# Patient Record
Sex: Male | Born: 1960 | Race: White | Hispanic: Yes | Marital: Married | Smoking: Never smoker
Health system: Southern US, Community
[De-identification: ages and names within clinical notes are randomized; demographics above are authoritative.]

## PROBLEM LIST (undated history)

## (undated) DIAGNOSIS — E119 Type 2 diabetes mellitus without complications: Secondary | ICD-10-CM

## (undated) DIAGNOSIS — M109 Gout, unspecified: Secondary | ICD-10-CM

## (undated) DIAGNOSIS — I10 Essential (primary) hypertension: Secondary | ICD-10-CM

---

## 2018-04-04 ENCOUNTER — Emergency Department (HOSPITAL_BASED_OUTPATIENT_CLINIC_OR_DEPARTMENT_OTHER): Payer: BLUE CROSS/BLUE SHIELD

## 2018-04-04 ENCOUNTER — Other Ambulatory Visit: Payer: Self-pay

## 2018-04-04 ENCOUNTER — Observation Stay (HOSPITAL_BASED_OUTPATIENT_CLINIC_OR_DEPARTMENT_OTHER)
Admission: EM | Admit: 2018-04-04 | Discharge: 2018-04-05 | Disposition: A | Discharge status: Home / Self Care | Payer: BLUE CROSS/BLUE SHIELD | Attending: Internal Medicine | Admitting: Internal Medicine

## 2018-04-04 ENCOUNTER — Encounter (HOSPITAL_BASED_OUTPATIENT_CLINIC_OR_DEPARTMENT_OTHER): Payer: Self-pay | Admitting: Emergency Medicine

## 2018-04-04 DIAGNOSIS — R079 Chest pain, unspecified: Secondary | ICD-10-CM

## 2018-04-04 DIAGNOSIS — G4733 Obstructive sleep apnea (adult) (pediatric): Secondary | ICD-10-CM | POA: Diagnosis not present

## 2018-04-04 DIAGNOSIS — Z6835 Body mass index (BMI) 35.0-35.9, adult: Secondary | ICD-10-CM | POA: Diagnosis not present

## 2018-04-04 DIAGNOSIS — Z23 Encounter for immunization: Secondary | ICD-10-CM | POA: Insufficient documentation

## 2018-04-04 DIAGNOSIS — M546 Pain in thoracic spine: Secondary | ICD-10-CM | POA: Diagnosis not present

## 2018-04-04 DIAGNOSIS — E559 Vitamin D deficiency, unspecified: Secondary | ICD-10-CM | POA: Diagnosis not present

## 2018-04-04 DIAGNOSIS — I10 Essential (primary) hypertension: Secondary | ICD-10-CM | POA: Diagnosis not present

## 2018-04-04 DIAGNOSIS — Z79899 Other long term (current) drug therapy: Secondary | ICD-10-CM | POA: Diagnosis not present

## 2018-04-04 DIAGNOSIS — M109 Gout, unspecified: Secondary | ICD-10-CM | POA: Diagnosis present

## 2018-04-04 DIAGNOSIS — E119 Type 2 diabetes mellitus without complications: Secondary | ICD-10-CM | POA: Insufficient documentation

## 2018-04-04 DIAGNOSIS — E785 Hyperlipidemia, unspecified: Secondary | ICD-10-CM | POA: Diagnosis not present

## 2018-04-04 DIAGNOSIS — E1165 Type 2 diabetes mellitus with hyperglycemia: Secondary | ICD-10-CM

## 2018-04-04 DIAGNOSIS — Z7982 Long term (current) use of aspirin: Secondary | ICD-10-CM | POA: Insufficient documentation

## 2018-04-04 DIAGNOSIS — R0789 Other chest pain: Secondary | ICD-10-CM | POA: Diagnosis not present

## 2018-04-04 HISTORY — DX: Type 2 diabetes mellitus without complications: E11.9

## 2018-04-04 HISTORY — DX: Essential (primary) hypertension: I10

## 2018-04-04 HISTORY — DX: Gout, unspecified: M10.9

## 2018-04-04 LAB — BASIC METABOLIC PANEL
Anion gap: 7 (ref 5–15)
BUN: 20 mg/dL (ref 6–20)
CHLORIDE: 104 mmol/L (ref 98–111)
CO2: 22 mmol/L (ref 22–32)
Calcium: 8.7 mg/dL — ABNORMAL LOW (ref 8.9–10.3)
Creatinine, Ser: 1.01 mg/dL (ref 0.61–1.24)
GFR calc Af Amer: >60 mL/min (ref >60)
GFR calc non Af Amer: >60 mL/min (ref >60)
GLUCOSE: 305 mg/dL — AB (ref 70–99)
Potassium: 4.1 mmol/L (ref 3.5–5.1)
Sodium: 133 mmol/L — ABNORMAL LOW (ref 135–145)

## 2018-04-04 LAB — CBC
HEMATOCRIT: 39.7 % (ref 39.0–52.0)
Hemoglobin: 13.8 g/dL (ref 13.0–17.0)
MCH: 30.6 pg (ref 26.0–34.0)
MCHC: 34.8 g/dL (ref 30.0–36.0)
MCV: 88 fL (ref 80.0–100.0)
Platelets: 199 10*3/uL (ref 150–400)
RBC: 4.51 MIL/uL (ref 4.22–5.81)
RDW: 12 % (ref 11.5–15.5)
WBC: 6.5 10*3/uL (ref 4.0–10.5)
nRBC: 0 % (ref 0.0–0.2)

## 2018-04-04 LAB — GLUCOSE, CAPILLARY: Glucose-Capillary: 216 mg/dL — ABNORMAL HIGH (ref 70–99)

## 2018-04-04 LAB — TROPONIN I
Troponin I: <0.03 ng/mL (ref <0.03)
Troponin I: <0.03 ng/mL (ref <0.03)

## 2018-04-04 MED ORDER — ACETAMINOPHEN 325 MG PO TABS: 650.0000 mg | ORAL_TABLET | ORAL | Status: DC | PRN

## 2018-04-04 MED ORDER — ONDANSETRON HCL 4 MG/2ML IJ SOLN: 4.0000 mg | Freq: Four times a day (QID) | INTRAMUSCULAR | Status: DC | PRN

## 2018-04-04 MED ORDER — ENOXAPARIN SODIUM 40 MG/0.4ML ~~LOC~~ SOLN: 40.0000 mg | SUBCUTANEOUS | Status: DC

## 2018-04-04 MED ORDER — ASPIRIN 81 MG PO CHEW
324.0000 mg | CHEWABLE_TABLET | Freq: Once | ORAL | Status: AC
  Administered 2018-04-04: 324 mg via ORAL
  Filled 2018-04-04: qty 4

## 2018-04-04 MED ORDER — NITROGLYCERIN 0.4 MG SL SUBL: 0.4000 mg | SUBLINGUAL_TABLET | SUBLINGUAL | Status: DC | PRN

## 2018-04-04 MED ORDER — INFLUENZA VAC SPLIT QUAD 0.5 ML IM SUSY
0.5000 mL | PREFILLED_SYRINGE | INTRAMUSCULAR | Status: AC
  Administered 2018-04-05: 0.5 mL via INTRAMUSCULAR
  Filled 2018-04-04: qty 0.5

## 2018-04-04 MED ORDER — ALPRAZOLAM 0.5 MG PO TABS: 0.5000 mg | ORAL_TABLET | Freq: Every evening | ORAL | Status: DC | PRN

## 2018-04-04 MED ORDER — INSULIN ASPART 100 UNIT/ML ~~LOC~~ SOLN
0.0000 [IU] | Freq: Three times a day (TID) | SUBCUTANEOUS | Status: DC
  Administered 2018-04-05 (×2): 8 [IU] via SUBCUTANEOUS

## 2018-04-04 NOTE — ED Triage Notes (Signed)
Patient states that at about 130 pm he started to have pain to he left shoulder, and scapula around to his left chest - he started to became dizzy and had SOB - the patient denies any of these symptoms at this time

## 2018-04-04 NOTE — H&P (Signed)
History and Physical    James Shannon:[id number] DOB: December 28, 1960 DOA: 04/04/2018  PCP: Patient, No Pcp Per   Patient coming from: [facility]/  I have personally briefly reviewed patient's old medical records in [facility]  Chief Complaint: Chest pain.  HPI: James Shannon is a 57 y.o. male with medical history significant of type 2 diabetes, gout, hypertension currently not on medications who is coming to the emergency department with complaints of chest pain.  He states that the pain started around 1:30 PM while he was sitting, pressure-like, started on his left mid back and he went around all the way to his left precordial area in left shoulder.  Associated symptoms well with mild dyspnea, palpitations and mild nausea.  There was no dizziness or emesis.  The pain lasted around 15 minutes and the resolved on its own.  He complains of having episodes of PND recently, last one was about 2 nights ago.  He is supposed to be on CPAP at 9, but has trouble tolerating the mask, which makes him claustrophobic.  He denies orthopnea or pitting edema of the lower extremities.  No headache, vision changes, sore throat, wheezing, hemoptysis, abdominal pain, diarrhea, constipation, melena or hematochezia.  Denies dysuria, frequency or hematuria.  No polyuria, polydipsia, polyphagia or blurred vision.  He denies skin pruritus.  ED Course: Initial vital signs temperature 98.6 F, pulse 58, respirations 18, blood pressure 155/83 mmHg and O2 sat 100% on room air.  The patient received 324 mg of aspirin in the emergency department.  EKGs showed sinus bradycardia with left axis deviation.  There is no previous tracing to compare to.  Troponin x2 so far negative. CBC was normal.  Sodium was 133 mmol/L.  Glucose 305 and calcium 8.7 mg/dL.  All other CMP values are within normal limits.  His chest radiograph showed mild calcific atherosclerotic disease and tortuosity of the thoracic aortic.  Otherwise no active  cardiopulmonary disease.  Please see images and full radiology report for further detail.  Review of Systems: As per HPI otherwise 10 point review of systems negative.   Past Medical History:  Diagnosis Date  . Diabetes mellitus without complication (HCC)   . Gout   . Hypertension     History reviewed. No pertinent surgical history.   reports that he has never smoked. He has never used smokeless tobacco. He reports that he drinks alcohol. He reports that he does not use drugs.  No Known Allergies  Family medical history Mother- Type 2 diabetes.  Prior to Admission medications   Medication Sig Start Date End Date Taking? Authorizing Provider  allopurinol (ZYLOPRIM) 100 MG tablet Take 2 tablet twice daily. 06/30/17  Yes [provider]  lisinopril (PRINIVIL,ZESTRIL) 20 MG tablet Take 20 mg by mouth daily.  06/30/17  Yes [provider]  metFORMIN (GLUCOPHAGE) 850 MG tablet Take 850 mg by mouth daily with breakfast.  06/30/17  Yes [provider]    Physical Exam: Vitals:   04/04/18 1730 04/04/18 1800 04/04/18 1858 04/04/18 2033  BP: 140/89 134/77 (!) 160/103 136/90  Pulse: (!) 57 (!) 52 (!) 53 (!) 54  Resp: 19   20  Temp:   98.7 F (37.1 C) 98.3 F (36.8 C)  TempSrc:   Oral Oral  SpO2: 100% 97% 100% 96%  Weight:      Height:        Constitutional: NAD, calm, comfortable Eyes: PERRL, lids and conjunctivae normal ENMT: Mucous membranes are moist. Posterior pharynx  clear of any exudate or lesions. Neck: normal, supple, no masses, no thyromegaly Respiratory: clear to auscultation bilaterally, no wheezing, no crackles. Normal respiratory effort. No accessory muscle use.  Cardiovascular: Regular rate and rhythm, no murmurs / rubs / gallops. No extremity edema. 2+ pedal pulses. No carotid bruits.  Abdomen: Obese, soft, no tenderness, no masses palpated. No hepatosplenomegaly. Bowel sounds positive.  Musculoskeletal: no clubbing / cyanosis.. Good ROM, no  contractures. Normal muscle tone.  Skin: no rashes, lesions, ulcers. No induration Neurologic: CN 2-12 grossly intact. Sensation intact, DTR normal. Strength 5/5 in all 4.  Psychiatric: Normal judgment and insight. Alert and oriented x 3. Normal mood.   Labs on Admission: I have personally reviewed following labs and imaging studies  CBC: Recent Labs  Lab 04/04/18 1442  WBC 6.5  HGB 13.8  HCT 39.7  MCV 88.0  PLT 199   Basic Metabolic Panel: Recent Labs  Lab 04/04/18 1442  NA 133*  K 4.1  CL 104  CO2 22  GLUCOSE 305*  BUN 20  CREATININE 1.01  CALCIUM 8.7*   GFR: Estimated Creatinine Clearance: 96.7 mL/min (by C-G formula based on SCr of 1.01 mg/dL). Liver Function Tests: No results for input(s): AST, ALT, ALKPHOS, BILITOT, PROT, ALBUMIN in the last 168 hours. No results for input(s): LIPASE, AMYLASE in the last 168 hours. No results for input(s): AMMONIA in the last 168 hours. Coagulation Profile: No results for input(s): INR, PROTIME in the last 168 hours. Cardiac Enzymes: Recent Labs  Lab 04/04/18 1442 04/04/18 1736  TROPONINI <0.03 <0.03   BNP (last 3 results) No results for input(s): PROBNP in the last 8760 hours. HbA1C: No results for input(s): HGBA1C in the last 72 hours. CBG: No results for input(s): GLUCAP in the last 168 hours. Lipid Profile: No results for input(s): CHOL, HDL, LDLCALC, TRIG, CHOLHDL, LDLDIRECT in the last 72 hours. Thyroid Function Tests: No results for input(s): TSH, T4TOTAL, FREET4, T3FREE, THYROIDAB in the last 72 hours. Anemia Panel: No results for input(s): VITAMINB12, FOLATE, FERRITIN, TIBC, IRON, RETICCTPCT in the last 72 hours. Urine analysis: No results found for: COLORURINE, APPEARANCEUR, LABSPEC, PHURINE, GLUCOSEU, HGBUR, BILIRUBINUR, KETONESUR, PROTEINUR, UROBILINOGEN, NITRITE, LEUKOCYTESUR  Radiological Exams on Admission: Dg Chest 2 View  Result Date: 04/04/2018 CLINICAL DATA:  Left-sided chest pain. EXAM: CHEST -  2 VIEW COMPARISON:  None. FINDINGS: Cardiomediastinal silhouette is normal. Mediastinal contours appear intact. Mild calcific atherosclerotic disease and tortuosity of the thoracic aorta. There is no evidence of focal airspace consolidation, pleural effusion or pneumothorax. Osseous structures are without acute abnormality. Soft tissues are grossly normal. IMPRESSION: Mild calcific atherosclerotic disease and tortuosity of the thoracic aorta. Otherwise no active cardiopulmonary disease. Electronically Signed   By: Ted Mcalpineobrinka  Dimitrova M.D.   On: 04/04/2018 15:39    EKG: Independently reviewed.  EKG #1 Vent. rate 54 BPM PR interval 194 ms QRS duration 98 ms QT/QTc 438/415 ms P-R-T axes 54 -38 49 Sinus bradycardia Left axis deviation Abnormal ECG  EKG #2 Vent. rate 55 BPM PR interval 182 ms QRS duration 98 ms QT/QTc 444/424 ms P-R-T axes 53 -40 53 Sinus bradycardia Left axis deviation Possible Anterior infarct , age undetermined  Assessment/Plan Principal Problem:   Atypical chest pain Observation/telemetry. Supplemental oxygen as needed. Sublingual nitroglycerin as needed. Trend troponin levels. Repeat EKG in a.m. Needs to establish with PCP for risk factor control/treatment.  Active Problems:   Hypertension Resume lisinopril. Monitor BP, renal function electrolytes.    Gout Resume allopurinol.  Type 2 diabetes mellitus (HCC) Carbohydrate modified diet. CBG monitoring with regular insulin sliding scale while in the hospital. Check hemoglobin A1c.   DVT prophylaxis: Heparin SQ. Code Status:.  Full code. Family Communication: Disposition Plan: Observation for telemetry monitoring and troponin level trending. Consults called: Admission status: Observation/telemetry.   Bobette Mo MD [facility] Pager [phone].  If 7PM-7AM, please contact night-coverage [email or web address] Password [facility]  04/04/2018, 8:44 PM

## 2018-04-04 NOTE — ED Notes (Signed)
[facility]

## 2018-04-04 NOTE — ED Provider Notes (Signed)
[facility] EMERGENCY DEPARTMENT Provider Note   CSN: [id number] Arrival date & time: 04/04/18  1409     History   Chief Complaint Chief Complaint  Patient presents with  . Chest Pain    HPI James Shannon is a 57 y.o. male.  HPI   Patient is a 57 year old male with a history of 2 diabetes mellitus, hypertension, gout, presenting for left-sided chest discomfort.  It began around 1:30 PM today and lasted approximate 50 minutes.  He reports that at the time, he felt a tightness in the left side of the chest, beginning in the back and radiating towards the front, and became diaphoretic, short of breath, and nauseous.  He reports it resolved spontaneously.  He reports that he had a history of a stress test approximately 4 years ago that was normal, and no other history of CAD.  Denies any family history of early MI.  Patient reports that he is a former everyday smoker, however for the past several years, he smokes proximally 1 pack a year.  Denies alcohol use with the exception of every other weekend and holidays.  Patient denies any recent hospitalization, recent surgery, history DVT/PE, hormone use, cancer treatment, lower tremor edema or calf tenderness, or hemoptysis.  Patient does report that [place] 3 days ago, however he exited the car every 3 hours, and is a Agricultural consultantlong-distance truck driver.  Patient denies any recent illnesses.  Patient is completely asymptomatic at present.  Past Medical History:  Diagnosis Date  . Diabetes mellitus without complication (HCC)   . Gout   . Hypertension     There are no active problems to display for this patient.   History reviewed. No pertinent surgical history.      Home Medications    Prior to Admission medications   Not on File    Family History History reviewed. No pertinent family history.  Social History Social History   Tobacco Use  . Smoking status: Never Smoker  . Smokeless tobacco: Never Used    Substance Use Topics  . Alcohol use: Yes  . Drug use: Never     Allergies   Patient has no known allergies.   Review of Systems Review of Systems  Constitutional: Negative for chills and fever.  HENT: Negative for congestion and sore throat.   Eyes: Negative for visual disturbance.  Respiratory: Positive for chest tightness and shortness of breath. Negative for cough.   Cardiovascular: Positive for chest pain. Negative for palpitations and leg swelling.  Gastrointestinal: Negative for abdominal pain, nausea and vomiting.  Genitourinary: Negative for dysuria and flank pain.  Musculoskeletal: Negative for back pain and myalgias.  Skin: Negative for rash.  Neurological: Negative for dizziness, syncope, light-headedness and headaches.     Physical Exam Updated Vital Signs BP 127/84   Pulse (!) 55   Temp 98.6 F (37 C) (Oral)   Resp 17   Ht 5\' 8"  (1.727 m)   Wt 106.6 kg   SpO2 96%   BMI 35.73 kg/m   Physical Exam  Constitutional: He appears well-developed and well-nourished. No distress.  HENT:  Head: Normocephalic and atraumatic.  Mouth/Throat: Oropharynx is clear and moist.  Eyes: Pupils are equal, round, and reactive to light. Conjunctivae and EOM are normal.  Neck: Normal range of motion. Neck supple.  Cardiovascular: Regular rhythm, S1 normal, S2 normal and intact distal pulses. Bradycardia present.  No murmur heard. Pulses:      Radial pulses are 2+  on the right side, and 2+ on the left side.  Pulmonary/Chest: Effort normal and breath sounds normal. He has no wheezes. He has no rales.  Abdominal: Soft. He exhibits no distension. There is no tenderness. There is no guarding.  Musculoskeletal: Normal range of motion. He exhibits no edema or deformity.  Lymphadenopathy:    He has no cervical adenopathy.  Neurological: He is alert.  Cranial nerves grossly intact. Patient moves extremities symmetrically and with good coordination.  Skin: Skin is warm and dry. No  rash noted. No erythema.  Psychiatric: He has a normal mood and affect. His behavior is normal. Judgment and thought content normal.  Nursing note and vitals reviewed.    ED Treatments / Results  Labs (all labs ordered are listed, but only abnormal results are displayed) Labs Reviewed  BASIC METABOLIC PANEL - Abnormal; Notable for the following components:      Result Value   Sodium 133 (*)    Glucose, Bld 305 (*)    Calcium 8.7 (*)    All other components within normal limits  CBC  TROPONIN I    EKG EKG Interpretation  Date/Time:  Saturday April 04 2018 14:23:58 EST Ventricular Rate:  54 PR Interval:  194 QRS Duration: 98 QT Interval:  438 QTC Calculation: 415 R Axis:   -38 Text Interpretation:  Sinus bradycardia Left axis deviation Abnormal ECG No old tracing to compare Confirmed by Calhoun, Doreatha Martin [phone]) on 04/04/2018 2:38:57 PM   Radiology Dg Chest 2 View  Result Date: 04/04/2018 CLINICAL DATA:  Left-sided chest pain. EXAM: CHEST - 2 VIEW COMPARISON:  None. FINDINGS: Cardiomediastinal silhouette is normal. Mediastinal contours appear intact. Mild calcific atherosclerotic disease and tortuosity of the thoracic aorta. There is no evidence of focal airspace consolidation, pleural effusion or pneumothorax. Osseous structures are without acute abnormality. Soft tissues are grossly normal. IMPRESSION: Mild calcific atherosclerotic disease and tortuosity of the thoracic aorta. Otherwise no active cardiopulmonary disease. Electronically Signed   By: Ted Mcalpine M.D.   On: 04/04/2018 15:39    Procedures Procedures (including critical care time)  Medications Ordered in ED Medications  aspirin chewable tablet 324 mg (324 mg Oral Given 04/04/18 1513)     Initial Impression / Assessment and Plan / ED Course  I have reviewed the triage vital signs and the nursing notes.  Pertinent labs & imaging results that were available during my care of the patient were  reviewed by me and considered in my medical decision making (see chart for details).  Clinical Course as of Apr 04 1820  Sat Apr 04, 2018  1552 Pt is diabetic on Metformin.   Glucose(!): 305 [AM]  1724 nRBC: 0.0 [AM]  1728 Spoke with Dr. Caleb Popp of [facility] will admit the patient.  I appreciate his involvement in the care of this patient.   [AM]    Clinical Course User Index [AM] Elisha Ponder, PA-C    Differential diagnosis includes ACS, PE, thoracic aortic dissection, Boerhaave's syndrome, cardiac tamponade, pneumothorax, incarcerated diaphragmatic hernia, cholecystitis, esophageal spasm, gastroesophageal reflux, herpes zoster of the thorax, pericarditis, pneumonia,  chest wall pain, costochondritis.   Patient story concerning for ACS.  Heart score is 4-5.  Given ASA Initial EKG and repeat EKG demonstrating proximally 1 mm ST elevation in V1, but no reciprocal or consecutive changes.  Likely early repolarization pattern. Doubt PE as Well's score 0  and PERC negative,  and patient not tachycardic. Doubt TAD by hx, CXR showed no widening mediastinum,  and pulses equal in all extremities.  Patient's chest x-ray did demonstrate tortuous aorta, the patient is having no active symptoms, and do doubt that this is indicative of an acute aortic pathology.  Patient remained nontoxic appearing and in no acute distress during emergency department course. Vital signs stable in the emergency department. Therefore, doubt esophageal rupture, cardiac tamponade, or pneumothorax. Pericarditis less likely due to no preceding infectious symptoms and pain not improved in upright positions. Abnormal labs include hyperglycemia.  Due to risk of MACE, admission is advised at this time.  Attending physician, Dr. Sharion Dove independently evaluated patient and agrees.  Patient and family understand and are in agreement with plan of care.  This is a shared visit with Dr. Rolan Bucco. Patient was independently  evaluated by this attending physician. Attending physician consulted in evaluation and management.  Final Clinical Impressions(s) / ED Diagnoses   Final diagnoses:  Left-sided chest pain  Acute left-sided thoracic back pain    ED Discharge Orders    None       Delia Chimes 04/04/18 Alisia Ferrari, MD 04/04/18 2259

## 2018-04-04 NOTE — ED Notes (Signed)
[facility] notified (Doug) - patient ready for transport 

## 2018-04-05 ENCOUNTER — Encounter (HOSPITAL_COMMUNITY): Payer: Self-pay | Admitting: Internal Medicine

## 2018-04-05 ENCOUNTER — Observation Stay (HOSPITAL_BASED_OUTPATIENT_CLINIC_OR_DEPARTMENT_OTHER): Payer: BLUE CROSS/BLUE SHIELD

## 2018-04-05 DIAGNOSIS — M109 Gout, unspecified: Secondary | ICD-10-CM | POA: Diagnosis not present

## 2018-04-05 DIAGNOSIS — E119 Type 2 diabetes mellitus without complications: Secondary | ICD-10-CM

## 2018-04-05 DIAGNOSIS — R079 Chest pain, unspecified: Secondary | ICD-10-CM

## 2018-04-05 DIAGNOSIS — I10 Essential (primary) hypertension: Secondary | ICD-10-CM | POA: Diagnosis not present

## 2018-04-05 DIAGNOSIS — E1165 Type 2 diabetes mellitus with hyperglycemia: Secondary | ICD-10-CM | POA: Diagnosis not present

## 2018-04-05 DIAGNOSIS — R0789 Other chest pain: Secondary | ICD-10-CM | POA: Diagnosis not present

## 2018-04-05 LAB — COMPREHENSIVE METABOLIC PANEL
ALT: 24 U/L (ref 0–44)
AST: 15 U/L (ref 15–41)
Albumin: 3.8 g/dL (ref 3.5–5.0)
Alkaline Phosphatase: 105 U/L (ref 38–126)
Anion gap: 7 (ref 5–15)
BUN: 19 mg/dL (ref 6–20)
CO2: 26 mmol/L (ref 22–32)
Calcium: 8.9 mg/dL (ref 8.9–10.3)
Chloride: 105 mmol/L (ref 98–111)
Creatinine, Ser: 0.88 mg/dL (ref 0.61–1.24)
Glucose, Bld: 287 mg/dL — ABNORMAL HIGH (ref 70–99)
Potassium: 3.6 mmol/L (ref 3.5–5.1)
Sodium: 138 mmol/L (ref 135–145)
Total Bilirubin: 0.4 mg/dL (ref 0.3–1.2)
Total Protein: 6.6 g/dL (ref 6.5–8.1)

## 2018-04-05 LAB — TROPONIN I: Troponin I: <0.03 ng/mL (ref <0.03)

## 2018-04-05 LAB — ECHOCARDIOGRAM COMPLETE
Height: 68 in
WEIGHTICAEL: 3760 [oz_av]

## 2018-04-05 LAB — GLUCOSE, CAPILLARY
Glucose-Capillary: 284 mg/dL — ABNORMAL HIGH (ref 70–99)
Glucose-Capillary: 274 mg/dL — ABNORMAL HIGH (ref 70–99)

## 2018-04-05 LAB — HIV ANTIBODY (ROUTINE TESTING W REFLEX): HIV Screen 4th Generation wRfx: NONREACTIVE

## 2018-04-05 LAB — D-DIMER, QUANTITATIVE: D-Dimer, Quant: <0.27 ug/mL-FEU (ref 0.00–0.50)

## 2018-04-05 MED ORDER — ALLOPURINOL 100 MG PO TABS
200.0000 mg | ORAL_TABLET | Freq: Two times a day (BID) | ORAL | Status: DC
  Administered 2018-04-05: 200 mg via ORAL
  Filled 2018-04-05: qty 2

## 2018-04-05 MED ORDER — ATORVASTATIN CALCIUM 40 MG PO TABS: 40.0000 mg | ORAL_TABLET | Freq: Every day | ORAL | Status: DC

## 2018-04-05 MED ORDER — METFORMIN HCL 850 MG PO TABS: 850.0000 mg | ORAL_TABLET | Freq: Every day | ORAL | Status: DC

## 2018-04-05 MED ORDER — METFORMIN HCL 850 MG PO TABS
850.0000 mg | ORAL_TABLET | Freq: Every day | ORAL | Status: DC
  Administered 2018-04-05: 850 mg via ORAL
  Filled 2018-04-05: qty 1

## 2018-04-05 MED ORDER — ATORVASTATIN CALCIUM 40 MG PO TABS: 40.0000 mg | ORAL_TABLET | Freq: Every day | ORAL | 0 refills | Status: AC

## 2018-04-05 MED ORDER — ASPIRIN EC 81 MG PO TBEC: 81.0000 mg | DELAYED_RELEASE_TABLET | Freq: Every day | ORAL | 0 refills | Status: AC

## 2018-04-05 MED ORDER — LISINOPRIL 20 MG PO TABS
20.0000 mg | ORAL_TABLET | Freq: Every day | ORAL | Status: DC
  Administered 2018-04-05: 20 mg via ORAL
  Filled 2018-04-05: qty 1

## 2018-04-05 NOTE — Consult Note (Addendum)
Cardiology Consultation:   Patient ID: James Shannon MRN: [id number]; DOB: 07-03-1960  Admit date: 04/04/2018 Date of Consult: 04/05/2018  Primary Care Provider: Patient, No Pcp Per Primary Cardiologist: No primary care provider on file. New Primary Electrophysiologist:  None    Patient Profile:   James Shannon is a 57 y.o. male with a hx of diabetes hypertension hyperlipidemia who is being seen today for the evaluation of chest pain at the request of Dr. Waymon Amato.  History of Present Illness:   James Shannon 57 year old with diabetes hypertension hyperlipidemia gout who began to have chest pain yesterday afternoon at approximately 1:30 PM that was pressure moderate in intensity left mid back then went around to his left chest area as well as shoulder area.  That he felt some mild nausea mild shortness of breath as well as palpitations.  It seemed to last about 15 minutes and then went away.  He also does not like to wear his CPAP mask.  Denies any fevers chills nausea vomiting syncope bleeding.  Troponins were normal.  Aortic atherosclerosis was noted on chest x-ray.  ECG personally reviewed and interpreted showed sinus bradycardia without any other ischemic changes.  Care everywhere explored but no cardiology notes or cardiology testing previously.  Currently he is chest pain-free.  Comfortable.  Wife at bedside.  He is a [employer], makes stops only in [place].  Past Medical History:  Diagnosis Date  . Diabetes mellitus without complication (HCC)   . Gout   . Hypertension     History reviewed. No pertinent surgical history.   Home Medications:  Prior to Admission medications   Medication Sig Start Date End Date Taking? Authorizing Provider  allopurinol (ZYLOPRIM) 100 MG tablet Take 200 mg by mouth 2 (two) times daily.  06/30/17  Yes [provider]  lisinopril (PRINIVIL,ZESTRIL) 20 MG tablet Take 20 mg by mouth daily.  06/30/17  Yes [provider]    metFORMIN (GLUCOPHAGE) 850 MG tablet Take 850 mg by mouth daily with breakfast.  06/30/17  Yes [provider]    Inpatient Medications: Scheduled Meds: . allopurinol  200 mg Oral BID  . enoxaparin (LOVENOX) injection  40 mg Subcutaneous Q24H  . Influenza vac split quadrivalent PF  0.5 mL Intramuscular Tomorrow-1000  . insulin aspart  0-15 Units Subcutaneous TID WC  . lisinopril  20 mg Oral Daily  . metFORMIN  850 mg Oral Q breakfast   Continuous Infusions:  PRN Meds: acetaminophen, ALPRAZolam, nitroGLYCERIN, ondansetron (ZOFRAN) IV  Allergies:   No Known Allergies  Social History:   Social History   Socioeconomic History  . Marital status: Married    Spouse name: Not on file  . Number of children: Not on file  . Years of education: Not on file  . Highest education level: Not on file  Occupational History  . Not on file  Social Needs  . Financial resource strain: Not on file  . Food insecurity:    Worry: Not on file    Inability: Not on file  . Transportation needs:    Medical: Not on file    Non-medical: Not on file  Tobacco Use  . Smoking status: Never Smoker  . Smokeless tobacco: Never Used  Substance and Sexual Activity  . Alcohol use: Yes  . Drug use: Never  . Sexual activity: Not on file  Lifestyle  . Physical activity:    Days per week: Not on file    Minutes per session: Not on file  .  Stress: Not on file  Relationships  . Social connections:    Talks on phone: Not on file    Gets together: Not on file    Attends religious service: Not on file    Active member of club or organization: Not on file    Attends meetings of clubs or organizations: Not on file    Relationship status: Not on file  . Intimate partner violence:    Fear of current or ex partner: Not on file    Emotionally abused: Not on file    Physically abused: Not on file    Forced sexual activity: Not on file  Other Topics Concern  . Not on file  Social History Narrative   . Not on file    Family History:    Family History  Problem Relation Age of Onset  . Diabetes Mellitus II Mother      ROS:  Please see the history of present illness.   All other ROS reviewed and negative.     Physical Exam/Data:   Vitals:   04/04/18 1800 04/04/18 1858 04/04/18 2033 04/05/18 0443  BP: 134/77 (!) 160/103 136/90 (!) 160/91  Pulse: (!) 52 (!) 53 (!) 54 (!) 53  Resp:   20 18  Temp:  98.7 F (37.1 C) 98.3 F (36.8 C) 98.2 F (36.8 C)  TempSrc:  Oral Oral Oral  SpO2: 97% 100% 96% 99%  Weight:      Height:        Intake/Output Summary (Last 24 hours) at 04/05/2018 0933 Last data filed at 04/05/2018 0700 Gross per 24 hour  Intake -  Output 1 ml  Net -1 ml   Filed Weights   04/04/18 1418  Weight: 106.6 kg   Body mass index is 35.73 kg/m.  General:  Well nourished, well developed, in no acute distress obese HEENT: normal Lymph: no adenopathy Neck: no JVD Endocrine:  No thryomegaly Vascular: No carotid bruits; FA pulses 2+ bilaterally without bruits  Cardiac:  normal S1, S2; RRR; no murmur  Lungs:  clear to auscultation bilaterally, no wheezing, rhonchi or rales  Abd: soft, nontender, no hepatomegaly  Ext: no edema Musculoskeletal:  No deformities, BUE and BLE strength normal and equal Skin: warm and dry  Neuro:  CNs 2-12 intact, no focal abnormalities noted Psych:  Normal affect   EKG:  The EKG was personally reviewed and demonstrates: Heart rate 57 with mild J-point elevation noted in the early precordial leads otherwise unremarkable, personally reviewed and interpreted  Telemetry:  Telemetry was personally reviewed and demonstrates:  NSR/SBrady  Relevant CV Studies: Echocardiogram 04/05/2018 - Left ventricle: The cavity size was normal. There was mild focal   basal hypertrophy of the septum. Systolic function was normal.   The estimated ejection fraction was in the range of 60% to 65%.   Wall motion was normal; there were no regional wall  motion   abnormalities. Doppler parameters are consistent with abnormal   left ventricular relaxation (grade 1 diastolic dysfunction). - Right ventricle: The cavity size was mildly dilated. Wall   thickness was normal.  Laboratory Data:  Chemistry Recent Labs  Lab 04/04/18 1442 04/05/18 0445  NA 133* 138  K 4.1 3.6  CL 104 105  CO2 22 26  GLUCOSE 305* 287*  BUN 20 19  CREATININE 1.01 0.88  CALCIUM 8.7* 8.9  GFRNONAA >60 >60  GFRAA >60 >60  ANIONGAP 7 7    Recent Labs  Lab 04/05/18 0445  PROT 6.6  ALBUMIN 3.8  AST 15  ALT 24  ALKPHOS 105  BILITOT 0.4   Hematology Recent Labs  Lab 04/04/18 1442  WBC 6.5  RBC 4.51  HGB 13.8  HCT 39.7  MCV 88.0  MCH 30.6  MCHC 34.8  RDW 12.0  PLT 199   Cardiac Enzymes Recent Labs  Lab 04/04/18 1442 04/04/18 1736 04/04/18 2338 04/05/18 0445  TROPONINI <0.03 <0.03 <0.03 <0.03   No results for input(s): TROPIPOC in the last 168 hours.  BNPNo results for input(s): BNP, PROBNP in the last 168 hours.  DDimer No results for input(s): DDIMER in the last 168 hours.  Radiology/Studies:  Dg Chest 2 View  Result Date: 04/04/2018 CLINICAL DATA:  Left-sided chest pain. EXAM: CHEST - 2 VIEW COMPARISON:  None. FINDINGS: Cardiomediastinal silhouette is normal. Mediastinal contours appear intact. Mild calcific atherosclerotic disease and tortuosity of the thoracic aorta. There is no evidence of focal airspace consolidation, pleural effusion or pneumothorax. Osseous structures are without acute abnormality. Soft tissues are grossly normal. IMPRESSION: Mild calcific atherosclerotic disease and tortuosity of the thoracic aorta. Otherwise no active cardiopulmonary disease. Electronically Signed   By: Ted Mcalpineobrinka  Dimitrova M.D.   On: 04/04/2018 15:39    Assessment and Plan:   Chest pain with aortic atherosclerosis, diabetes with hypertension hyperlipidemia, obstructive sleep apnea, morbid obesity. -Several cardiac risk factors as listed  above. -Thankfully his troponins have been normal.  EKG does not show any ischemic changes.  He had a stress test several years ago which was normal.  Echocardiogram today is reassuring, normal EF. -Since he is out of town, lives in [place], he may have his stress test evaluation performed at home.  Obviously if he has further worsening chest discomfort, he should seek medical attention immediately.  Both he and his wife understand and are comfortable with this plan.  It is reasonable to proceed with prompt outpatient evaluation with stress test.  I did explain to him that he may very well have underlying significant coronary artery disease given his multiple comorbidities. - I will start atorvastatin 40 mg p.o. once a day.  High intensity dose. - No beta-blocker because of resting bradycardia. -Continue with ACE inhibitor -Continue with low-dose aspirin.  Diabetes with hypertension -Continue with ACE inhibitor.  Morbid obesity -BMI 35 with multiple comorbidities.  Continue to encourage weight loss.  Decrease carbohydrates.   [facility] will sign off.   Medication Recommendations:  Atorvastatin is new Other recommendations (labs, testing, etc):  Needs stress test evaluation in [place].  Follow up as an outpatient:  As above  For questions or updates, please contact [facility] Please consult [email or web address] for contact info under     Signed, Donato SchultzMark Cayden Rautio, MD  04/05/2018 9:33 AM

## 2018-04-05 NOTE — Progress Notes (Signed)
Pt appears comfprtabole in bed, wife at bedside. Pt denies chest and or other pain, denies nausea on this shift. Per pt and wife, pt has started f

## 2018-04-05 NOTE — Progress Notes (Signed)
  Echocardiogram 2D Echocardiogram has been performed.  Leta JunglingCooper, Lamia Mariner M 04/05/2018, 8:42 AM

## 2018-04-05 NOTE — Progress Notes (Signed)
Pt. Refuses usage of CPAP at this time.  Tells nurse that he sleeps like a baby without device. Will be available if patient changes his mind.

## 2018-04-05 NOTE — Discharge Summary (Signed)
Physician Discharge Summary  James Shannon RUE:[id number] DOB: 05/08/1960  PCP: Patient, No Pcp Per  Admit date: 04/04/2018 Discharge date: 04/05/2018  Recommendations for Outpatient Follow-up:  1. PCP in hometown in [place] upon returning home asap, follow-up regarding Cardiology consultation and outpatient stress test.  Home Health: None Equipment/Devices: None  Discharge Condition: Improved and stable CODE STATUS: Full Diet recommendation: Heart healthy and diabetic diet.  Discharge Diagnoses:  Principal Problem:   Atypical chest pain Active Problems:   Hypertension   Gout   Type 2 diabetes mellitus [facility](HCC)   Brief Summary: 57 year old married male, resident of [place], truck driver by occupation, drove to [place] to visit daughter for the Thanksgiving holidays, PMH of DM 2, HTN, HLD, gout, OSA on nightly CPAP, hypovitaminosis D, [facility] ED on 04/04/2018 due to chest pain.  Patient and his spouse drove from [place] to [place] on day prior to Thanksgiving, reportedly a 10-hour drive, they took multiple stops on the way.  On day of admission, patient was sitting on a chair and at approximately 1:30 PM noted subacute onset of left sided mid back pain that subsequently radiated to precordial area and worsened, pressure like, 7/10 in intensity, radiated to left side of neck and?  Shoulder area, associated with mild dyspnea, nausea and palpitations but no diaphoresis.  This resolved spontaneously after 15 to 20 minutes without recurrence.  He denies any asymmetric pain or swelling of his lower extremities.  He denies any other complaints.  They then drove to the ED and patient was admitted for evaluation and management of chest pain.  He was admitted to telemetry which showed SR and mild sinus bradycardia in the 50s at night, possibly during sleep.  Troponins were cycled and negative.  EKG shows sinus bradycardia at 54 bpm, left axis deviation but no acute  ischemic changes.  No prior EKGs to compare.  Chest x-ray shows mild calcific atherosclerotic disease and tortuosity of the thoracic aorta otherwise no acute cardiopulmonary disease.  Lab work significant for hyperglycemia as noted below.  Given recent long distance car travel and being a truck driver by occupation, due to his presentation of chest pain and dyspnea, checked d-dimer which was negative.  He reports that he had done a Cardiac stress test about 5 years ago which was negative.  He denied family history of heart disease.  Since patient had multiple cardiac risk factors, I discussed with and consulted Cardiology who recommend outpatient stress test when he returns home to [place].  Patient has also been advised to seek immediate medical attention if he has any recurrence of chest pain.  TTE unremarkable and results as below.  As per cardiology, started high intensity statin, atorvastatin 40 mg daily and aspirin 81 mg daily.  No beta-blockers due to resting bradycardia (TSH normal in August).  Continue ACEI for hypertension.  Suspect that his diabetes and hypertension are not adequately controlled and patient is advised to follow-up with his PCP for further adjustments and management.  Patient and spouse indicate that they were planning to return home yesterday and will likely return home post discharge.  Chest pain: Management as indicated above. Essential hypertension: Continue prior home dose of ACEI.  Outpatient follow-up regarding better management of high blood pressure. Type II DM: Suspect poorly controlled.  Continue home dose of metformin and close outpatient follow-up with PCP for better management. OSA: Continue nightly CPAP.  Needs to lose weight. Gout: No acute flare.  Continue home dose  of allopurinol. Hyperlipidemia: Last LDL in care everywhere 12/22/2017: 113.  Cardiology has initiated atorvastatin, continue upon discharge. Morbid obesity/Body mass index is 35.73  kg/m.    Consultations:  Cardiology  Procedures:  TTE 04/05/2018: Study Conclusions  - Left ventricle: The cavity size was normal. There was mild focal   basal hypertrophy of the septum. Systolic function was normal.   The estimated ejection fraction was in the range of 60% to 65%.   Wall motion was normal; there were no regional wall motion   abnormalities. Doppler parameters are consistent with abnormal   left ventricular relaxation (grade 1 diastolic dysfunction). - Right ventricle: The cavity size was mildly dilated. Wall   thickness was normal.   Discharge Instructions  Discharge Instructions    Call MD for:  difficulty breathing, headache or visual disturbances   Complete by:  As directed    Call MD for:  severe uncontrolled pain   Complete by:  As directed    Diet - low sodium heart healthy   Complete by:  As directed    Diet Carb Modified   Complete by:  As directed    Increase activity slowly   Complete by:  As directed        Medication List    TAKE these medications   allopurinol 100 MG tablet Commonly known as:  ZYLOPRIM Take 200 mg by mouth 2 (two) times daily.   aspirin EC 81 MG tablet Take 1 tablet (81 mg total) by mouth daily.   atorvastatin 40 MG tablet Commonly known as:  LIPITOR Take 1 tablet (40 mg total) by mouth daily at 6 PM.   lisinopril 20 MG tablet Commonly known as:  PRINIVIL,ZESTRIL Take 20 mg by mouth daily.   metFORMIN 850 MG tablet Commonly known as:  GLUCOPHAGE Take 850 mg by mouth daily with breakfast.      Follow-up Information    Family Doctor in [place] Follow up.   Why:  Follow up as soon as you get home to assist with Cardiology consultation and heart stress test.         No Known Allergies    Procedures/Studies: Dg Chest 2 View  Result Date: 04/04/2018 CLINICAL DATA:  Left-sided chest pain. EXAM: CHEST - 2 VIEW COMPARISON:  None. FINDINGS: Cardiomediastinal silhouette is normal. Mediastinal contours  appear intact. Mild calcific atherosclerotic disease and tortuosity of the thoracic aorta. There is no evidence of focal airspace consolidation, pleural effusion or pneumothorax. Osseous structures are without acute abnormality. Soft tissues are grossly normal. IMPRESSION: Mild calcific atherosclerotic disease and tortuosity of the thoracic aorta. Otherwise no active cardiopulmonary disease. Electronically Signed   By: Ted Mcalpine M.D.   On: 04/04/2018 15:39      Subjective: Patient preferred that I speak with his spouse for interpretation rather than use the video interpreter.  Patient interviewed and examined with assistance from spouse at bedside.  Patient history as noted above.  No recurrence of chest pain or dyspnea since hospital admission.  Denies any other complaints.  Discharge Exam:  Vitals:   04/04/18 1800 04/04/18 1858 04/04/18 2033 04/05/18 0443  BP: 134/77 (!) 160/103 136/90 (!) 160/91  Pulse: (!) 52 (!) 53 (!) 54 (!) 53  Resp:   20 18  Temp:  98.7 F (37.1 C) 98.3 F (36.8 C) 98.2 F (36.8 C)  TempSrc:  Oral Oral Oral  SpO2: 97% 100% 96% 99%  Weight:      Height:  General: Pleasant young male, moderately built and obese, lying comfortably supine in bed. Cardiovascular: S1 & S2 heard, RRR, S1/S2 +. No murmurs, rubs, gallops or clicks. No JVD or pedal edema.  Telemetry personally reviewed: Sinus rhythm.  Some sinus bradycardia in the 50s overnight, possibly when asleep. Respiratory: Clear to auscultation without wheezing, rhonchi or crackles. No increased work of breathing. Abdominal:  Non distended, non tender & soft. No organomegaly or masses appreciated. Normal bowel sounds heard. CNS: Alert and oriented. No focal deficits. Extremities: no edema, no cyanosis    The results of significant diagnostics from this hospitalization (including imaging, microbiology, ancillary and laboratory) are listed below for reference.        Labs: CBC: Recent Labs   Lab 04/04/18 1442  WBC 6.5  HGB 13.8  HCT 39.7  MCV 88.0  PLT 199   Basic Metabolic Panel: Recent Labs  Lab 04/04/18 1442 04/05/18 0445  NA 133* 138  K 4.1 3.6  CL 104 105  CO2 22 26  GLUCOSE 305* 287*  BUN 20 19  CREATININE 1.01 0.88  CALCIUM 8.7* 8.9   Liver Function Tests: Recent Labs  Lab 04/05/18 0445  AST 15  ALT 24  ALKPHOS 105  BILITOT 0.4  PROT 6.6  ALBUMIN 3.8    Cardiac Enzymes: Recent Labs  Lab 04/04/18 1442 04/04/18 1736 04/04/18 2338 04/05/18 0445  TROPONINI <0.03 <0.03 <0.03 <0.03   CBG: Recent Labs  Lab 04/04/18 2112 04/05/18 0730 04/05/18 1150  GLUCAP 216* 284* 274*   Discussed in detail with patient's spouse at bedside, updated care and answered questions.  Time coordinating discharge: 25 minutes  SIGNED:  Marcellus Scott, MD, FACP, [facility]. [facility] Pager [phone]  If 7PM-7AM, please contact night-coverage [email or web address] Password [facility] 04/05/2018, 12:24 PM

## 2018-04-05 NOTE — Discharge Instructions (Signed)

## 2018-04-05 NOTE — Plan of Care (Signed)
Discharge instructions reviewed with patient and wife, hard copy prescriptions given to wife.  Patient and wife verbalize the importance of following up with his PCP in [place] and getting a referral to a cardiologist for further follow-up.  Patient ambulatory from unit with wife.

## 2019-04-20 IMAGING — CR DG CHEST 2V
2 series · 2 of 2 positions shown · non-contrast
Comparison: None.

CLINICAL DATA: Left-sided chest pain.

EXAM:
CHEST - 2 VIEW

[w chest pa]
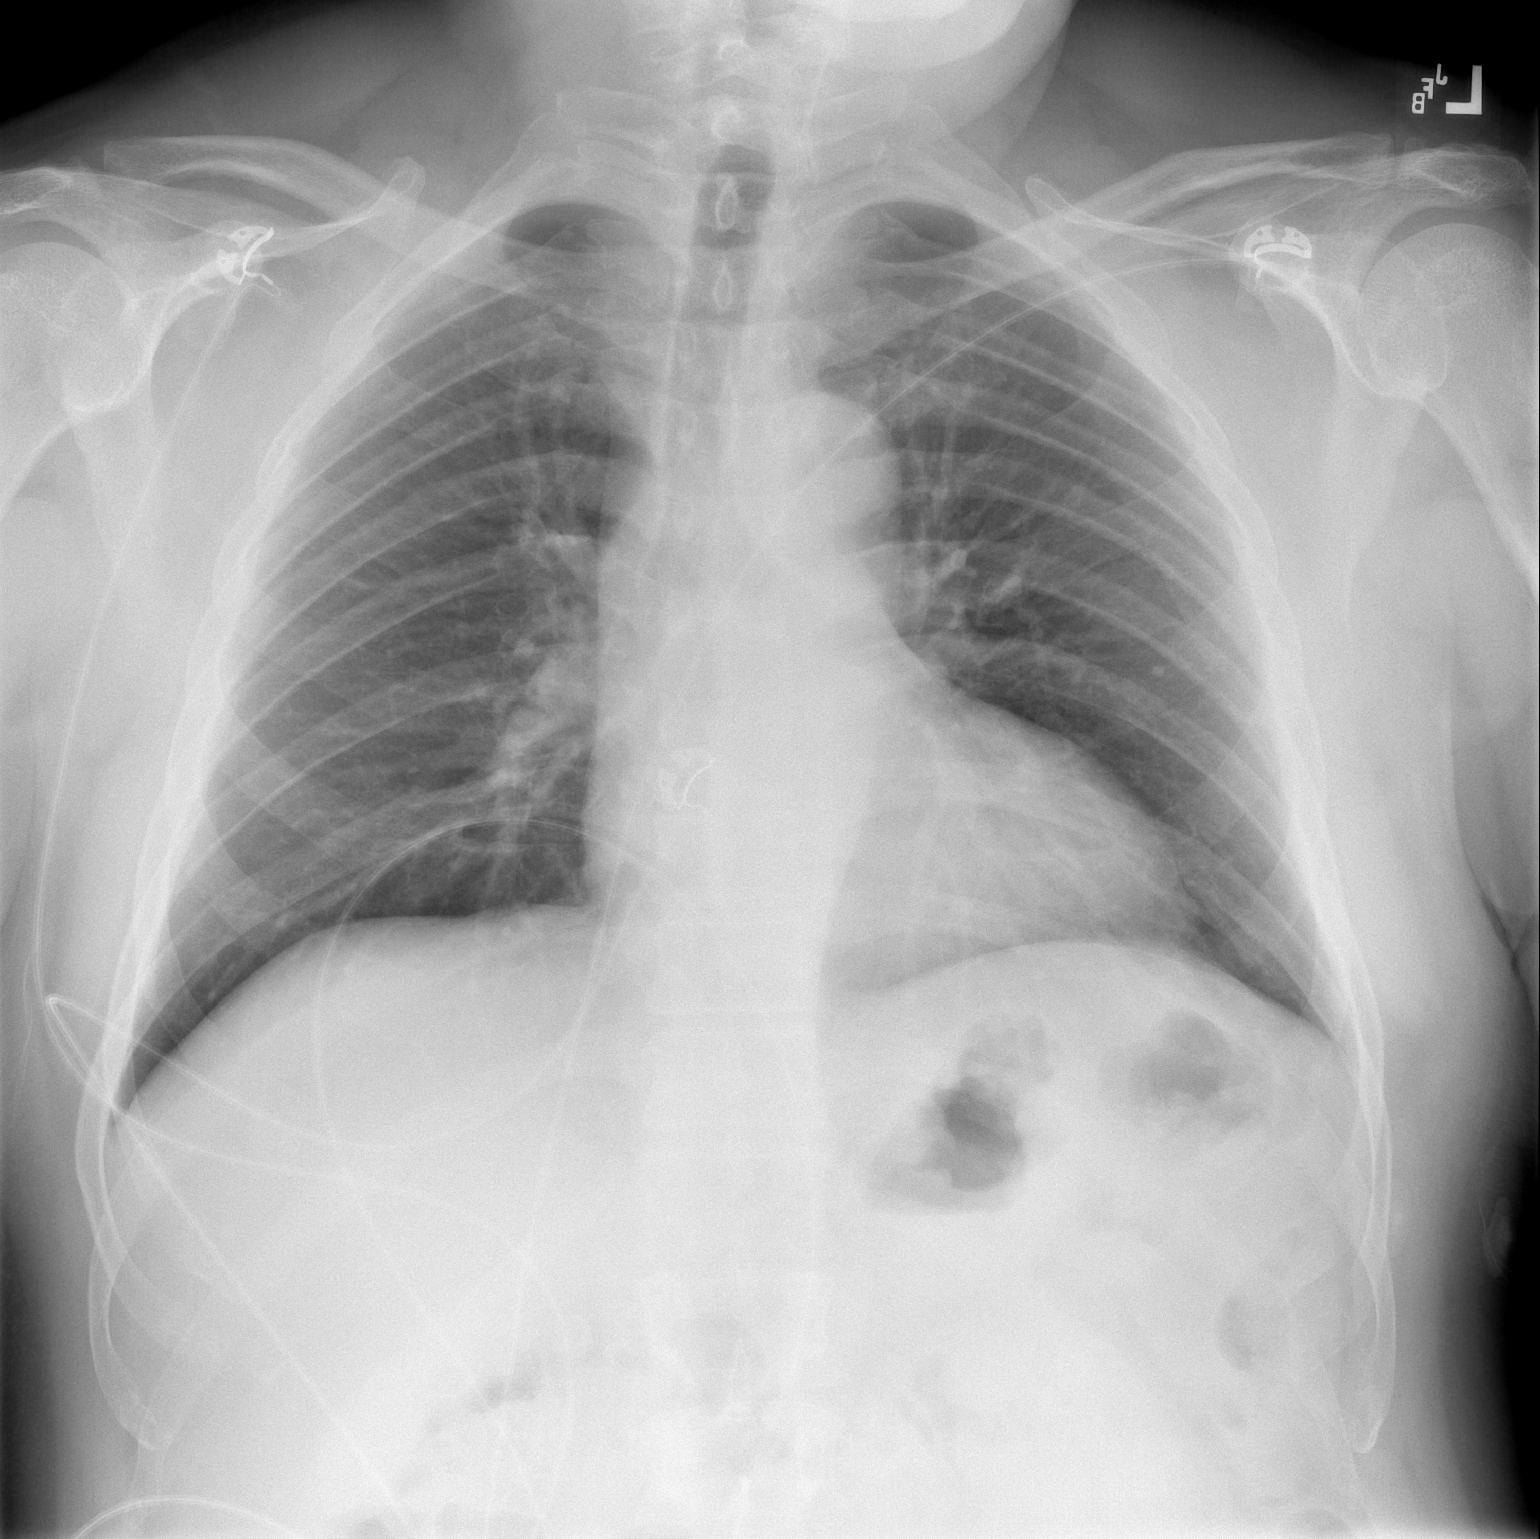

[w chest lat]
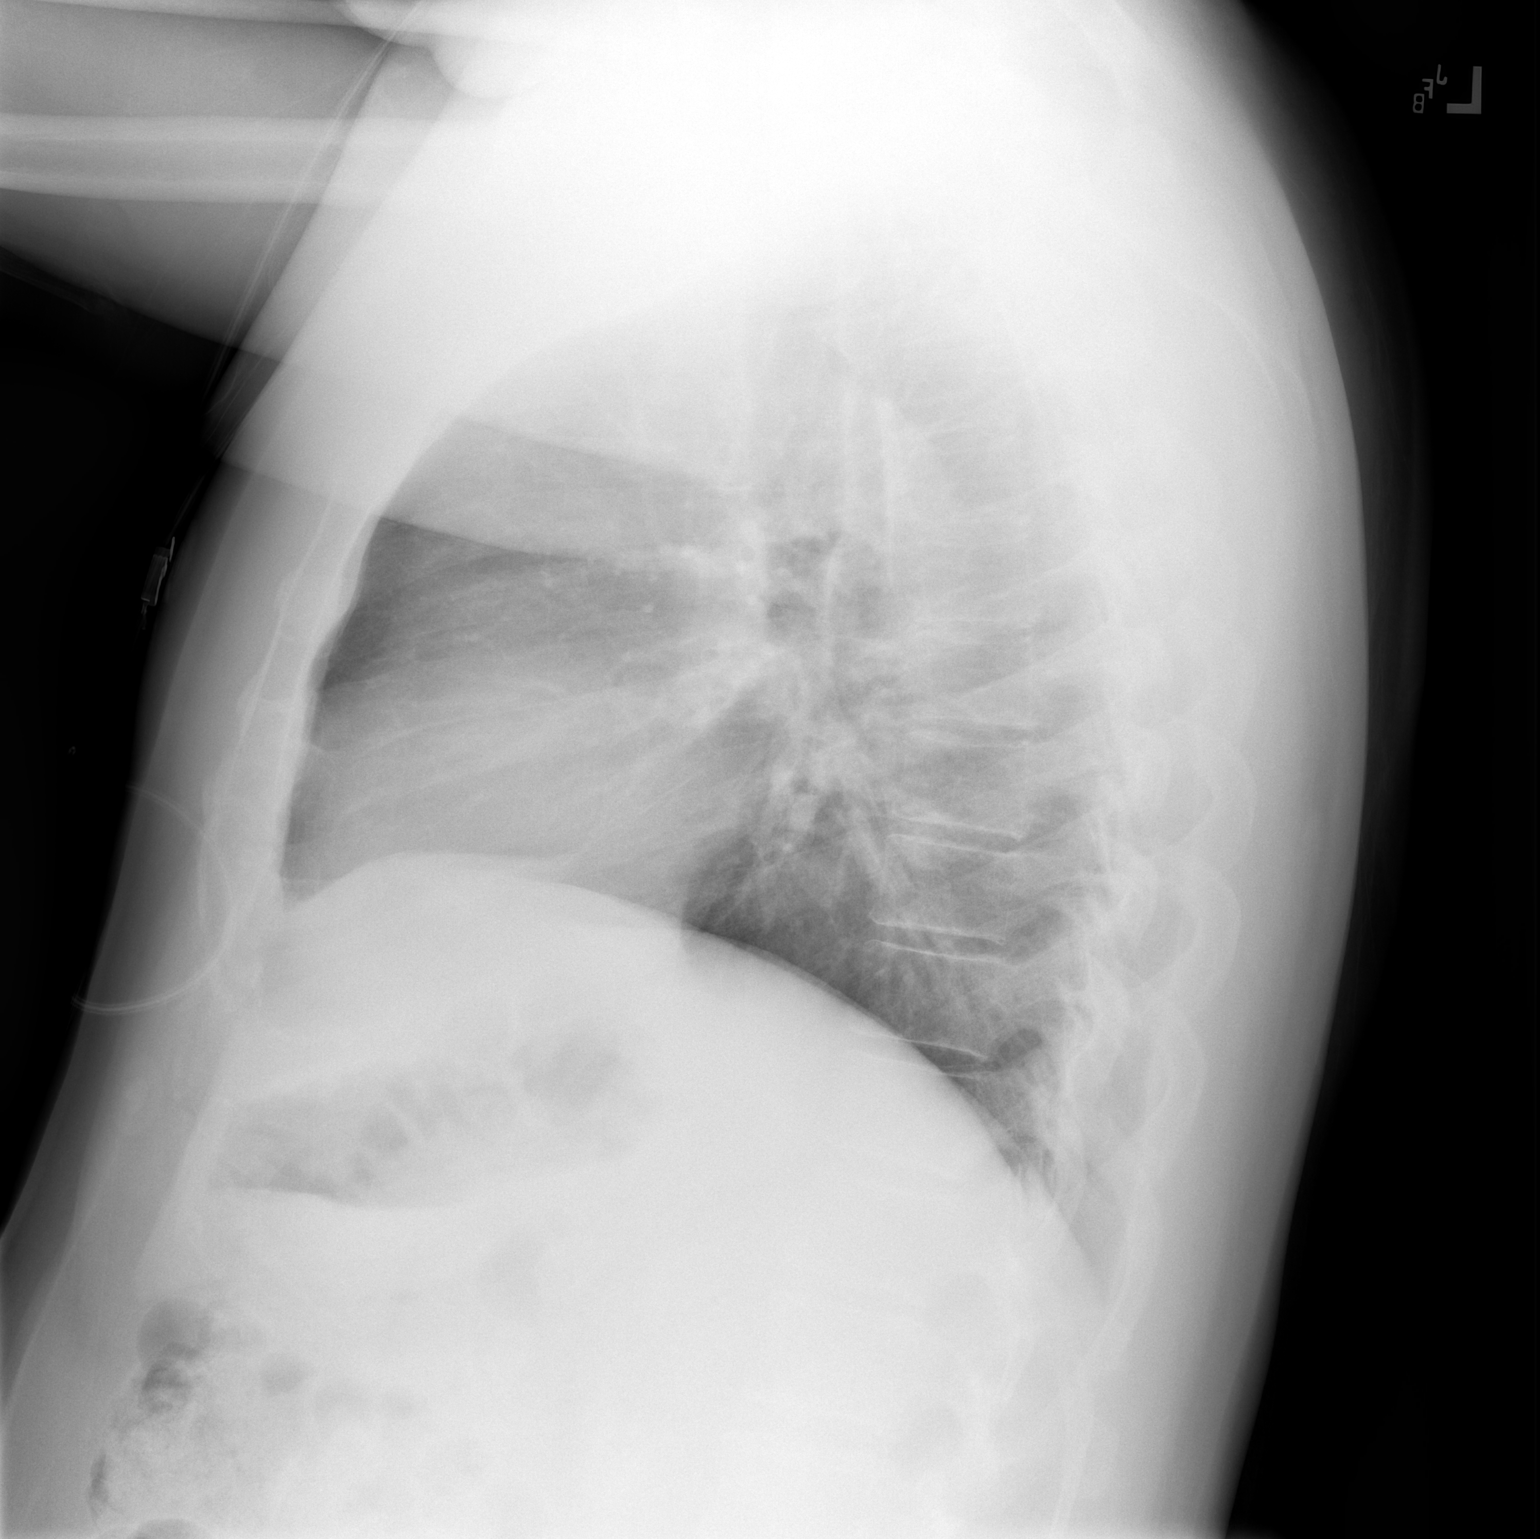

[2 of 2 positions shown; findings below may reference images not displayed]

FINDINGS: Cardiomediastinal silhouette is normal. Mediastinal contours appear
intact. Mild calcific atherosclerotic disease and tortuosity of the
thoracic aorta.

There is no evidence of focal airspace consolidation, pleural
effusion or pneumothorax.

Osseous structures are without acute abnormality. Soft tissues are
grossly normal.
IMPRESSION: Mild calcific atherosclerotic disease and tortuosity of the thoracic
aorta.

Otherwise no active cardiopulmonary disease.
